# Patient Record
Sex: Female | Born: 1935 | Race: White | Hispanic: No | Marital: Married | Smoking: Never smoker
Health system: Southern US, Community
[De-identification: ages and names within clinical notes are randomized; demographics above are authoritative.]

## PROBLEM LIST (undated history)

## (undated) HISTORY — PX: APPENDECTOMY: SHX54

---

## 2009-11-16 ENCOUNTER — Emergency Department: Payer: Self-pay | Admitting: Emergency Medicine

## 2011-07-15 IMAGING — CR DG TIBIA/FIBULA 2V*L*
1 series · 2 of 2 positions shown · non-contrast
Comparison: None

REASON FOR EXAM: injury painful to Karlos
COMMENTS:   LMP: Post-Menopausal

PROCEDURE:     DXR - DXR TIBIA AND FIBULA LT (LOWER L  - November 16, 2009  [DATE]
RESULT:     History: One pain

[Series 1: view not recorded · 0.17mm/px · 2 of 2 slices shown]
[im 1/2]
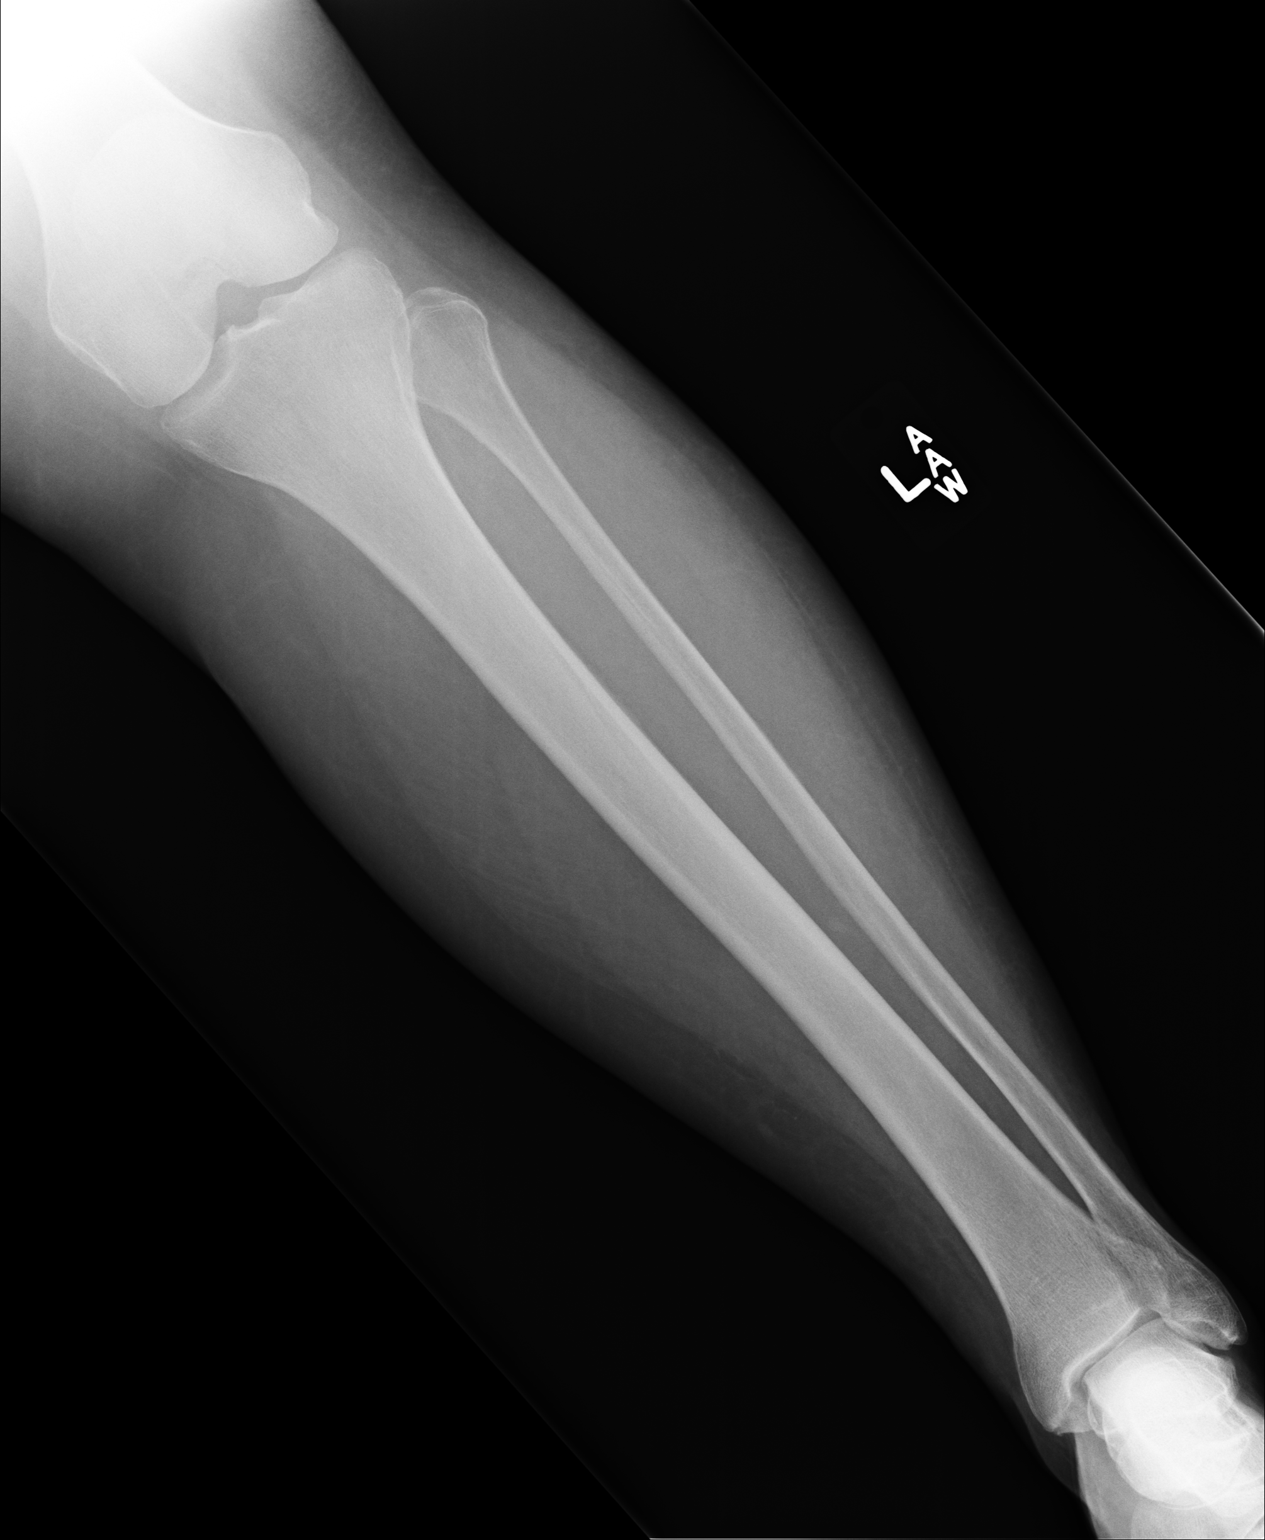
[im 2/2]
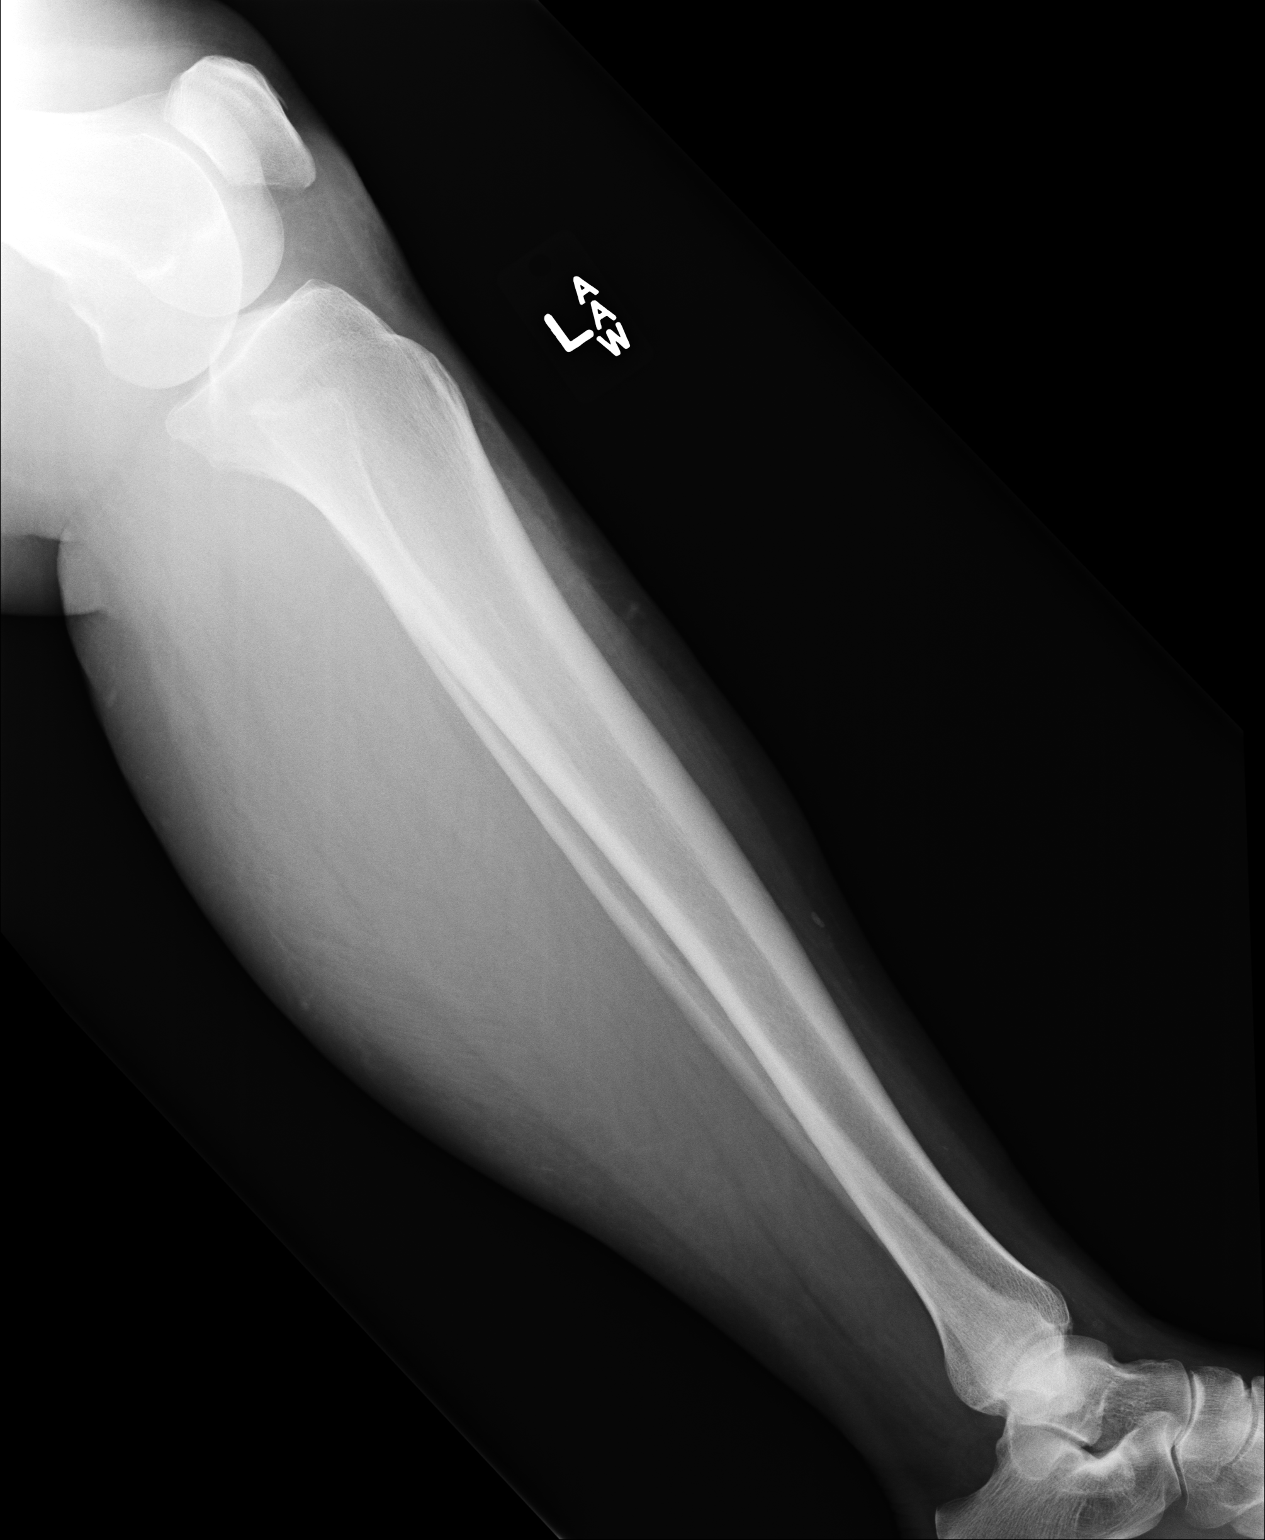

[2 of 2 positions shown; findings below may reference images not displayed]

FINDINGS: AP and lateral views of the left tibia and fibula demonstrates no acute
fracture or dislocation. The soft tissues are unremarkable.
IMPRESSION: No acute osseous injury of the left tibia and fibula.

## 2012-10-28 ENCOUNTER — Emergency Department: Payer: Self-pay | Admitting: Emergency Medicine

## 2013-09-06 ENCOUNTER — Emergency Department: Payer: Self-pay | Admitting: Emergency Medicine

## 2013-09-07 LAB — RAPID INFLUENZA A&B ANTIGENS

## 2013-09-09 LAB — BETA STREP CULTURE(ARMC)

## 2023-01-10 ENCOUNTER — Ambulatory Visit
Admission: EM | Admit: 2023-01-10 | Discharge: 2023-01-10 | Disposition: A | Discharge status: Home / Self Care | Payer: Medicare Other | Attending: Emergency Medicine | Admitting: Emergency Medicine

## 2023-01-10 DIAGNOSIS — H6122 Impacted cerumen, left ear: Secondary | ICD-10-CM

## 2023-01-10 DIAGNOSIS — R42 Dizziness and giddiness: Secondary | ICD-10-CM

## 2023-01-10 DIAGNOSIS — H6692 Otitis media, unspecified, left ear: Secondary | ICD-10-CM

## 2023-01-10 DIAGNOSIS — H6121 Impacted cerumen, right ear: Secondary | ICD-10-CM

## 2023-01-10 DIAGNOSIS — H60501 Unspecified acute noninfective otitis externa, right ear: Secondary | ICD-10-CM

## 2023-01-10 MED ORDER — OFLOXACIN 0.3 % OT SOLN: 10.0000 [drp] | Freq: Every day | OTIC | 0 refills | Status: AC

## 2023-01-10 MED ORDER — AZITHROMYCIN 250 MG PO TABS: 250.0000 mg | ORAL_TABLET | Freq: Every day | ORAL | 0 refills | Status: AC

## 2023-01-10 NOTE — ED Provider Notes (Signed)
[facility]    CSN: [id number] Arrival date & time: 01/10/23  1017      History   Chief Complaint Chief Complaint  Patient presents with   Otalgia    HPI SHAUNTRICE GRABE is a 87 y.o. female.  Accompanied by her son, patient presents with 2-day history of left ear pain and mild cough.  She had a brief episode of dizziness this morning when she was attempting to get up into her son's truck.  She felt "swimmy headed" for a few seconds.  This episode resolved spontaneously and has not recurred.  No fever, chills, chest pain, shortness of breath, focal weakness, or other symptoms.  No OTC medications taken at home.  No pertinent medical history.  The history is provided by the patient, a relative and medical records.    History reviewed. No pertinent past medical history.  There are no problems to display for this patient.   Past Surgical History:  Procedure Laterality Date   APPENDECTOMY      OB History   No obstetric history on file.      Home Medications    Prior to Admission medications   Medication Sig Start Date End Date Taking? Authorizing Provider  azithromycin (ZITHROMAX) 250 MG tablet Take 1 tablet (250 mg total) by mouth daily. Take first 2 tablets together, then 1 every day until finished. 01/10/23  Yes Mickie Bail, NP  ofloxacin (FLOXIN) 0.3 % OTIC solution Place 10 drops into both ears daily. 01/10/23  Yes Mickie Bail, NP    Family History History reviewed. No pertinent family history.  Social History Social History   Tobacco Use   Smoking status: Never   Smokeless tobacco: Never  Vaping Use   Vaping Use: Never used  Substance Use Topics   Alcohol use: Never   Drug use: Never     Allergies   Penicillins   Review of Systems Review of Systems  Constitutional:  Negative for chills and fever.  HENT:  Positive for ear pain. Negative for ear discharge and sore throat.   Respiratory:  Positive for cough. Negative for shortness  of breath.   Cardiovascular:  Negative for chest pain and palpitations.  Neurological:  Positive for dizziness. Negative for syncope, facial asymmetry, speech difficulty, weakness, numbness and headaches.     Physical Exam Triage Vital Signs ED Triage Vitals  Enc Vitals Group     BP 01/10/23 1146 130/74     Pulse Rate 01/10/23 1140 87     Resp 01/10/23 1140 18     Temp 01/10/23 1140 98 F (36.7 C)     Temp src --      SpO2 01/10/23 1140 95 %     Weight --      Height --      Head Circumference --      Peak Flow --      Pain Score 01/10/23 1143 4     Pain Loc --      Pain Edu? --      Excl. in GC? --    No data found.  Updated Vital Signs BP 130/74   Pulse 87   Temp 98 F (36.7 C)   Resp 18   SpO2 95%   Visual Acuity Right Eye Distance:   Left Eye Distance:   Bilateral Distance:    Right Eye Near:   Left Eye Near:    Bilateral Near:     Physical Exam Vitals and  nursing note reviewed.  Constitutional:      General: She is not in acute distress.    Appearance: Normal appearance. She is well-developed. She is not ill-appearing.  HENT:     Ears:     Comments: Right ear canal impacted with cerumen.  Excessive cerumen in left ear canal.  After cerumen removed, right ear canal noted to be erythematous; TM clear.  Left ear canal clear; TM erythemtous.     Nose: Nose normal.     Mouth/Throat:     Mouth: Mucous membranes are moist.     Pharynx: Oropharynx is clear.  Cardiovascular:     Rate and Rhythm: Normal rate and regular rhythm.     Heart sounds: Normal heart sounds.  Pulmonary:     Effort: Pulmonary effort is normal. No respiratory distress.     Breath sounds: Normal breath sounds.  Musculoskeletal:     Cervical back: Neck supple.  Skin:    General: Skin is warm and dry.     Capillary Refill: Capillary refill takes less than 2 seconds.  Neurological:     General: No focal deficit present.     Mental Status: She is alert and oriented to person, place,  and time.     Sensory: No sensory deficit.     Motor: No weakness.     Gait: Gait normal.  Psychiatric:        Mood and Affect: Mood normal.        Behavior: Behavior normal.      UC Treatments / Results  Labs (all labs ordered are listed, but only abnormal results are displayed) Labs Reviewed - No data to display  EKG   Radiology No results found.  Procedures Procedures (including critical care time)  Medications Ordered in UC Medications - No data to display  Initial Impression / Assessment and Plan / UC Course  I have reviewed the triage vital signs and the nursing notes.  Pertinent labs & imaging results that were available during my care of the patient were reviewed by me and considered in my medical decision making (see chart for details).    Right ear impacted cerumen, excessive cerumen in left ear canal.  Left otitis media, right otitis externa.  Dizziness.  Afebrile and vital signs are stable.  Patient and her son decline EKG or transfer to the ED. education provided on dizziness.  ED precautions discussed.  Cerumen removed from both ear canals via irrigation by RN.  After cerumen removal, right ear canal noted to be erythematous and left TM noted to be erythematous.  Treating with ofloxacin eardrops and Zithromax.  Instructed patient to follow-up with her PCP if her ear symptoms are not improving.  Education provided on otitis media and otitis externa.  They agree to plan of care.   Final Clinical Impressions(s) / UC Diagnoses   Final diagnoses:  Impacted cerumen of right ear  Excessive cerumen in ear canal, left  Dizziness  Left otitis media, unspecified otitis media type  Acute otitis externa of right ear, unspecified type     Discharge Instructions      Take the Zithromax and use the ear drops as directed.  Follow up with your primary care provider if your symptoms are not improving.    Go to the emergency department if you have dizziness or other  concerning symptoms.         ED Prescriptions     Medication Sig Dispense Auth. Provider   ofloxacin (FLOXIN)  0.3 % OTIC solution Place 10 drops into both ears daily. 5 mL Mickie Bail, NP   azithromycin (ZITHROMAX) 250 MG tablet Take 1 tablet (250 mg total) by mouth daily. Take first 2 tablets together, then 1 every day until finished. 6 tablet Mickie Bail, NP      PDMP not reviewed this encounter.   Mickie Bail, NP 01/10/23 1253

## 2023-01-10 NOTE — ED Triage Notes (Addendum)
Patient to Urgent Care with son, complaints of left ear pain that started two days ago. Reports feeling swimmy headed when exiting her son's high truck, states she feels fine now but became off balance.  Dry cough.

## 2023-01-10 NOTE — Discharge Instructions (Addendum)
Take the Zithromax and use the ear drops as directed.  Follow up with your primary care provider if your symptoms are not improving.    Go to the emergency department if you have dizziness or other concerning symptoms.

## 2024-07-09 ENCOUNTER — Emergency Department
Admission: EM | Admit: 2024-07-09 | Discharge: 2024-07-09 | Disposition: A | Discharge status: Home / Self Care | Attending: Emergency Medicine | Admitting: Emergency Medicine

## 2024-07-09 ENCOUNTER — Emergency Department

## 2024-07-09 DIAGNOSIS — J9801 Acute bronchospasm: Secondary | ICD-10-CM | POA: Diagnosis not present

## 2024-07-09 DIAGNOSIS — R0602 Shortness of breath: Secondary | ICD-10-CM | POA: Diagnosis present

## 2024-07-09 DIAGNOSIS — J209 Acute bronchitis, unspecified: Secondary | ICD-10-CM

## 2024-07-09 LAB — CBC
HCT: 39.8 % (ref 36.0–46.0)
Hemoglobin: 13 g/dL (ref 12.0–15.0)
MCH: 30.5 pg (ref 26.0–34.0)
MCHC: 32.7 g/dL (ref 30.0–36.0)
MCV: 93.4 fL (ref 80.0–100.0)
Platelets: 371 K/uL (ref 150–400)
RBC: 4.26 MIL/uL (ref 3.87–5.11)
RDW: 14.7 % (ref 11.5–15.5)
WBC: 7.7 K/uL (ref 4.0–10.5)
nRBC: 0 % (ref 0.0–0.2)

## 2024-07-09 LAB — COMPREHENSIVE METABOLIC PANEL WITH GFR
ALT: 11 U/L (ref 0–44)
AST: 25 U/L (ref 15–41)
Albumin: 3.4 g/dL — ABNORMAL LOW (ref 3.5–5.0)
Alkaline Phosphatase: 112 U/L (ref 38–126)
Anion gap: 10 (ref 5–15)
BUN: 14 mg/dL (ref 8–23)
CO2: 30 mmol/L (ref 22–32)
Calcium: 9.1 mg/dL (ref 8.9–10.3)
Chloride: 104 mmol/L (ref 98–111)
Creatinine, Ser: 0.95 mg/dL (ref 0.44–1.00)
GFR, Estimated: 58 mL/min — ABNORMAL LOW (ref >60)
Glucose, Bld: 119 mg/dL — ABNORMAL HIGH (ref 70–99)
Potassium: 4.6 mmol/L (ref 3.5–5.1)
Sodium: 143 mmol/L (ref 135–145)
Total Bilirubin: 0.5 mg/dL (ref 0.0–1.2)
Total Protein: 7.3 g/dL (ref 6.5–8.1)

## 2024-07-09 LAB — TROPONIN T, HIGH SENSITIVITY: Troponin T High Sensitivity: 17 ng/L (ref 0–19)

## 2024-07-09 LAB — PRO BRAIN NATRIURETIC PEPTIDE: Pro Brain Natriuretic Peptide: 482 pg/mL — ABNORMAL HIGH (ref <300.0)

## 2024-07-09 MED ORDER — METHYLPREDNISOLONE NA SUC (PF) 125 MG IJ SOLR
125.0000 mg | Freq: Once | INTRAMUSCULAR | Status: AC
  Administered 2024-07-09: 125 mg via INTRAVENOUS
  Filled 2024-07-09: qty 2

## 2024-07-09 MED ORDER — IPRATROPIUM-ALBUTEROL 0.5-2.5 (3) MG/3ML IN SOLN
3.0000 mL | Freq: Once | RESPIRATORY_TRACT | Status: AC
  Administered 2024-07-09: 3 mL via RESPIRATORY_TRACT
  Filled 2024-07-09: qty 3

## 2024-07-09 MED ORDER — ALBUTEROL SULFATE HFA 108 (90 BASE) MCG/ACT IN AERS: 2.0000 | INHALATION_SPRAY | Freq: Four times a day (QID) | RESPIRATORY_TRACT | 2 refills | Status: AC | PRN

## 2024-07-09 MED ORDER — PREDNISONE 50 MG PO TABS: 50.0000 mg | ORAL_TABLET | Freq: Every day | ORAL | 0 refills | Status: AC

## 2024-07-09 NOTE — ED Provider Notes (Signed)
 [facility] Provider Note    Event Date/Time   First MD Initiated Contact with Patient 07/09/24 0654     (approximate)   History   Shortness of Breath   HPI  Vanessa Ruiz is a 88 y.o. female who presents with reported shortness of breath.  Apparently woke up this morning feeling somewhat short of breath although she describes this is likely because her mouth was dry .  EMS reports room air saturations around 90% upon arrival.  They did treat with DuoNeb.  Patient does not smoke, no history of asthma reported.  She does report cough, no fevers, no lower extremity swelling     Physical Exam   Triage Vital Signs: ED Triage Vitals  Encounter Vitals Group     BP 07/09/24 0659 (!) 146/87     Girls Systolic BP Percentile --      Girls Diastolic BP Percentile --      Boys Systolic BP Percentile --      Boys Diastolic BP Percentile --      Pulse Rate 07/09/24 0656 (!) 108     Resp 07/09/24 0656 (!) 22     Temp 07/09/24 0656 98.6 F (37 C)     Temp Source 07/09/24 0656 Oral     SpO2 07/09/24 0656 96 %     Weight --      Height --      Head Circumference --      Peak Flow --      Pain Score 07/09/24 0657 0     Pain Loc --      Pain Education --      Exclude from Growth Chart --     Most recent vital signs: Vitals:   07/09/24 0700 07/09/24 0712  BP: 135/72   Pulse: (!) 106   Resp: 17   Temp:    SpO2:  98%     General: Awake, CV:  Good peripheral perfusion.  Heart rate mildly elevated Resp:  Mild tachypnea, no wheezing on exam Abd:  No distention.  Other:  No lower extremity edema noted   ED Results / Procedures / Treatments   Labs (all labs ordered are listed, but only abnormal results are displayed) Labs Reviewed  COMPREHENSIVE METABOLIC PANEL WITH GFR - Abnormal; Notable for the following components:      Result Value   Glucose, Bld 119 (*)    Albumin 3.4 (*)    GFR, Estimated 58 (*)    All other components within  normal limits  PRO BRAIN NATRIURETIC PEPTIDE - Abnormal; Notable for the following components:   Pro Brain Natriuretic Peptide 482.0 (*)    All other components within normal limits  CBC  TROPONIN T, HIGH SENSITIVITY     EKG  ED ECG REPORT I, Lamar Price, the attending physician, personally viewed and interpreted this ECG.  Date: 07/09/2024  Rhythm: normal sinus rhythm QRS Axis: normal Intervals: normal ST/T Wave abnormalities: normal Narrative Interpretation: Limited by significant tremor    RADIOLOGY Chest x-ray viewed interpreted by me, no acute abnormality   PROCEDURES:  Critical Care performed:   Procedures   MEDICATIONS ORDERED IN ED: Medications  ipratropium-albuterol (DUONEB) 0.5-2.5 (3) MG/3ML nebulizer solution 3 mL (3 mLs Nebulization Given 07/09/24 0855)  ipratropium-albuterol (DUONEB) 0.5-2.5 (3) MG/3ML nebulizer solution 3 mL (3 mLs Nebulization Given 07/09/24 0854)  methylPREDNISolone sodium succinate (SOLU-MEDROL) 125 MG injection 125 mg (125 mg Intravenous Given 07/09/24 0946)  IMPRESSION / MDM / ASSESSMENT AND PLAN / ED COURSE  I reviewed the triage vital signs and the nursing notes. Patient's presentation is most consistent with acute presentation with potential threat to life or bodily function.  Patient presents with shortness of breath as detailed above, differential includes pneumonia, CHF, bronchitis.  No current wheezing.  Will obtain x-ray, labs and monitor  ----------------------------------------- 8:56 AM on 07/09/2024 ----------------------------------------- On reevaluation patient is feeling well, she denies shortness of breath.  Possibly some scattered wheezing on exam, will trial DuoNeb.  Lab work overall reassuring, she does have a mildly elevated BNP but x-ray is unremarkable.  ----------------------------------------- 9:47 AM on 07/09/2024 -----------------------------------------  Patient remains well-appearing,  wheezing is improved.  To suspect a component of bronchitis/bronchospasm as the cause of her presentation.  No indication for admission at this time, will give a dose of IV Solu-Medrol, appropriate for discharge with albuterol and prednisone with close PCP follow-up.  Return precautions discussed, she and her grandson agree with this plan      FINAL CLINICAL IMPRESSION(S) / ED DIAGNOSES   Final diagnoses:  Bronchospasm with bronchitis, acute     Rx / DC Orders   ED Discharge Orders          Ordered    predniSONE (DELTASONE) 50 MG tablet  Daily with breakfast        07/09/24 0940    albuterol (VENTOLIN HFA) 108 (90 Base) MCG/ACT inhaler  Every 6 hours PRN        07/09/24 0941             Note:  This document was prepared using Dragon voice recognition software and may include unintentional dictation errors.   Arlander Charleston, MD 07/09/24 [id number]

## 2024-07-09 NOTE — ED Triage Notes (Signed)
 Pt to ED from home via [facility] for SOB.  Pt was sating at 90% on room air with audible wheezing. After one albuterol treatment and one douneb pt improved to 94% on 3L. And no wheezing. Pt as diagnosed with walking pneumonia two weeks ago. Pt is A&Ox4.   Pt's O2 is 97% on room air upon arrival.   Vitals were within normal limits for EMS.
